# Patient Record
Sex: Male | Born: 1965 | Race: White | Hispanic: No | Marital: Married | Smoking: Current every day smoker
Health system: Southern US, Community
[De-identification: ages and names within clinical notes are randomized; demographics above are authoritative.]

## PROBLEM LIST (undated history)

## (undated) DIAGNOSIS — R519 Headache, unspecified: Secondary | ICD-10-CM

## (undated) DIAGNOSIS — R51 Headache: Secondary | ICD-10-CM

## (undated) HISTORY — PX: NO PAST SURGERIES: SHX2092

---

## 1998-06-06 ENCOUNTER — Emergency Department (HOSPITAL_COMMUNITY): Admission: EM | Admit: 1998-06-06 | Discharge: 1998-06-06 | Payer: Self-pay | Admitting: Emergency Medicine

## 1999-11-29 ENCOUNTER — Emergency Department (HOSPITAL_COMMUNITY): Admission: EM | Admit: 1999-11-29 | Discharge: 1999-11-29 | Payer: Self-pay | Admitting: Emergency Medicine

## 2015-02-16 ENCOUNTER — Emergency Department (HOSPITAL_COMMUNITY)
Admission: EM | Admit: 2015-02-16 | Discharge: 2015-02-16 | Disposition: A | Discharge status: Home / Self Care | Payer: BLUE CROSS/BLUE SHIELD

## 2015-03-27 ENCOUNTER — Other Ambulatory Visit: Payer: Self-pay | Admitting: Surgical

## 2015-04-02 NOTE — Patient Instructions (Signed)
Garrett Nolan  04/02/2015   Your procedure is scheduled on:     04/11/2015    Report to [facility] Main  Entrance and follow signs to               Short Stay Center at    1100 AM.  Call this number if you have problems the morning of surgery [phone]   Remember: ONLY 1 PERSON MAY GO WITH YOU TO SHORT STAY TO GET  READY MORNING OF YOUR SURGERY.  Do not eat food or drink liquids :After Midnight.     Take these medicines the morning of surgery with A SIP OF WATER:   Hydrocodone if needed                                You may not have any metal on your body including hair pins and              piercings  Do not wear jewelry, make-upshave  48 hours prior to surgery.              Men may shave face and neck.   Do not bring valuables to the hospital. [facility] IS NOT             RESPONSIBLE   FOR VALUABLES.  Contacts, dentures or bridgework may not be worn into surgery.  Leave suitcase in the car. After surgery it may be brought to your room.         Special Instructions: coughing and deep breathing exercises, leg exercises               Please read over the following fact sheets you were given: _____________________________________________________________________             [facility] - Preparing for Surgery Before surgery, you can play an important role.  Because skin is not sterile, your skin needs to be as free of germs as possible.  You can reduce the number of germs on your skin by washing with CHG (chlorahexidine gluconate) soap before surgery.  CHG is an antiseptic cleaner which kills germs and bonds with the skin to continue killing germs even after washing. Please DO NOT use if you have an allergy to CHG or antibacterial soaps.  If your skin becomes reddened/irritated stop using the CHG and inform your nurse when you arrive at Short Stay. Do not shave (including legs and underarms) for at least 48 hours prior to the first CHG shower.  You may  shave your face/neck. Please follow these instructions carefully:  1.  Shower with CHG Soap the night before surgery and the  morning of Surgery.  2.  If you choose to wash your hair, wash your hair first as usual with your  normal  shampoo.  3.  After you shampoo, rinse your hair and body thoroughly to remove the  shampoo.                           4.  Use CHG as you would any other liquid soap.  You can apply chg directly  to the skin and wash                       Gently with a scrungie or clean  washcloth.  5.  Apply the CHG Soap to your body ONLY FROM THE NECK DOWN.   Do not use on face/ open                           Wound or open sores. Avoid contact with eyes, ears mouth and genitals (private parts).                       Wash face,  Genitals (private parts) with your normal soap.             6.  Wash thoroughly, paying special attention to the area where your surgery  will be performed.  7.  Thoroughly rinse your body with warm water from the neck down.  8.  DO NOT shower/wash with your normal soap after using and rinsing off  the CHG Soap.                9.  Pat yourself dry with a clean towel.            10.  Wear clean pajamas.            11.  Place clean sheets on your bed the night of your first shower and do not  sleep with pets. Day of Surgery : Do not apply any lotions/deodorants the morning of surgery.  Please wear clean clothes to the hospital/surgery center.  FAILURE TO FOLLOW THESE INSTRUCTIONS MAY RESULT IN THE CANCELLATION OF YOUR SURGERY PATIENT SIGNATURE_________________________________  NURSE SIGNATURE__________________________________  ________________________________________________________________________  WHAT IS A BLOOD TRANSFUSION? Blood Transfusion Information  A transfusion is the replacement of blood or some of its parts. Blood is made up of multiple cells which provide different functions.  Red blood cells carry oxygen and are used for blood loss  replacement.  White blood cells fight against infection.  Platelets control bleeding.  Plasma helps clot blood.  Other blood products are available for specialized needs, such as hemophilia or other clotting disorders. BEFORE THE TRANSFUSION  Who gives blood for transfusions?   Healthy volunteers who are fully evaluated to make sure their blood is safe. This is blood bank blood. Transfusion therapy is the safest it has ever been in the practice of medicine. Before blood is taken from a donor, a complete history is taken to make sure that person has no history of diseases nor engages in risky social behavior (examples are intravenous drug use or sexual activity with multiple partners). The donor's travel history is screened to minimize risk of transmitting infections, such as malaria. The donated blood is tested for signs of infectious diseases, such as HIV and hepatitis. The blood is then tested to be sure it is compatible with you in order to minimize the chance of a transfusion reaction. If you or a relative donates blood, this is often done in anticipation of surgery and is not appropriate for emergency situations. It takes many days to process the donated blood. RISKS AND COMPLICATIONS Although transfusion therapy is very safe and saves many lives, the main dangers of transfusion include:  1. Getting an infectious disease. 2. Developing a transfusion reaction. This is an allergic reaction to something in the blood you were given. Every precaution is taken to prevent this. The decision to have a blood transfusion has been considered carefully by your caregiver before blood is given. Blood is not given unless the benefits outweigh the risks. AFTER THE TRANSFUSION  Right after receiving a blood transfusion, you will usually feel much better and more energetic. This is especially true if your red blood cells have gotten low (anemic). The transfusion raises the level of the red blood cells which  carry oxygen, and this usually causes an energy increase.  The nurse administering the transfusion will monitor you carefully for complications. HOME CARE INSTRUCTIONS  No special instructions are needed after a transfusion. You may find your energy is better. Speak with your caregiver about any limitations on activity for underlying diseases you may have. SEEK MEDICAL CARE IF:   Your condition is not improving after your transfusion.  You develop redness or irritation at the intravenous (IV) site. SEEK IMMEDIATE MEDICAL CARE IF:  Any of the following symptoms occur over the next 12 hours:  Shaking chills.  You have a temperature by mouth above 102 F (38.9 C), not controlled by medicine.  Chest, back, or muscle pain.  People around you feel you are not acting correctly or are confused.  Shortness of breath or difficulty breathing.  Dizziness and fainting.  You get a rash or develop hives.  You have a decrease in urine output.  Your urine turns a dark color or changes to pink, red, or brown. Any of the following symptoms occur over the next 10 days:  You have a temperature by mouth above 102 F (38.9 C), not controlled by medicine.  Shortness of breath.  Weakness after normal activity.  The white part of the eye turns yellow (jaundice).  You have a decrease in the amount of urine or are urinating less often.  Your urine turns a dark color or changes to pink, red, or brown. Document Released: 10/03/2000 Document Revised: 12/29/2011 Document Reviewed: 05/22/2008 Exit[employer] Patient Information 2014 [place].  _______________________________________________________________________  Incentive Spirometer  An incentive spirometer is a tool that can help keep your lungs clear and active. This tool measures how well you are filling your lungs with each breath. Taking long deep breaths may help reverse or decrease the chance of developing breathing (pulmonary) problems  (especially infection) following:  A long period of time when you are unable to move or be active. BEFORE THE PROCEDURE   If the spirometer includes an indicator to show your best effort, your nurse or respiratory therapist will set it to a desired goal.  If possible, sit up straight or lean slightly forward. Try not to slouch.  Hold the incentive spirometer in an upright position. INSTRUCTIONS FOR USE  3. Sit on the edge of your bed if possible, or sit up as far as you can in bed or on a chair. 4. Hold the incentive spirometer in an upright position. 5. Breathe out normally. 6. Place the mouthpiece in your mouth and seal your lips tightly around it. 7. Breathe in slowly and as deeply as possible, raising the piston or the ball toward the top of the column. 8. Hold your breath for 3-5 seconds or for as long as possible. Allow the piston or ball to fall to the bottom of the column. 9. Remove the mouthpiece from your mouth and breathe out normally. 10. Rest for a few seconds and repeat Steps 1 through 7 at least 10 times every 1-2 hours when you are awake. Take your time and take a few normal breaths between deep breaths. 11. The spirometer may include an indicator to show your best effort. Use the indicator as a goal to work toward during each repetition. 12. After  each set of 10 deep breaths, practice coughing to be sure your lungs are clear. If you have an incision (the cut made at the time of surgery), support your incision when coughing by placing a pillow or rolled up towels firmly against it. Once you are able to get out of bed, walk around indoors and cough well. You may stop using the incentive spirometer when instructed by your caregiver.  RISKS AND COMPLICATIONS  Take your time so you do not get dizzy or light-headed.  If you are in pain, you may need to take or ask for pain medication before doing incentive spirometry. It is harder to take a deep breath if you are having  pain. AFTER USE  Rest and breathe slowly and easily.  It can be helpful to keep track of a log of your progress. Your caregiver can provide you with a simple table to help with this. If you are using the spirometer at home, follow these instructions: [place] IF:   You are having difficultly using the spirometer.  You have trouble using the spirometer as often as instructed.  Your pain medication is not giving enough relief while using the spirometer.  You develop fever of 100.5 F (38.1 C) or higher. SEEK IMMEDIATE MEDICAL CARE IF:   You cough up bloody sputum that had not been present before.  You develop fever of 102 F (38.9 C) or greater.  You develop worsening pain at or near the incision site. MAKE SURE YOU:   Understand these instructions.  Will watch your condition.  Will get help right away if you are not doing well or get worse. Document Released: 02/16/2007 Document Revised: 12/29/2011 Document Reviewed: 04/19/2007 [facility] Patient Information 2014 [place], [place].   ________________________________________________________________________

## 2015-04-05 ENCOUNTER — Ambulatory Visit (HOSPITAL_COMMUNITY)
Admission: RE | Admit: 2015-04-05 | Discharge: 2015-04-05 | Disposition: A | Discharge status: Home / Self Care | Payer: BLUE CROSS/BLUE SHIELD | Source: Ambulatory Visit | Attending: Surgical | Admitting: Surgical

## 2015-04-05 ENCOUNTER — Encounter (HOSPITAL_COMMUNITY): Payer: Self-pay

## 2015-04-05 ENCOUNTER — Encounter (HOSPITAL_COMMUNITY)
Admission: RE | Admit: 2015-04-05 | Discharge: 2015-04-05 | Disposition: A | Discharge status: Home / Self Care | Payer: BLUE CROSS/BLUE SHIELD | Source: Ambulatory Visit | Attending: Orthopedic Surgery | Admitting: Orthopedic Surgery

## 2015-04-05 DIAGNOSIS — I4510 Unspecified right bundle-branch block: Secondary | ICD-10-CM | POA: Diagnosis not present

## 2015-04-05 DIAGNOSIS — Z01812 Encounter for preprocedural laboratory examination: Secondary | ICD-10-CM | POA: Insufficient documentation

## 2015-04-05 DIAGNOSIS — Z01818 Encounter for other preprocedural examination: Secondary | ICD-10-CM

## 2015-04-05 DIAGNOSIS — Z0183 Encounter for blood typing: Secondary | ICD-10-CM | POA: Insufficient documentation

## 2015-04-05 HISTORY — DX: Headache, unspecified: R51.9

## 2015-04-05 HISTORY — DX: Headache: R51

## 2015-04-05 LAB — URINALYSIS, ROUTINE W REFLEX MICROSCOPIC
Bilirubin Urine: NEGATIVE
Glucose, UA: NEGATIVE mg/dL
Hgb urine dipstick: NEGATIVE
Ketones, ur: NEGATIVE mg/dL
Leukocytes, UA: NEGATIVE
Nitrite: NEGATIVE
Protein, ur: NEGATIVE mg/dL
Specific Gravity, Urine: 1.019 (ref 1.005–1.030)
Urobilinogen, UA: 0.2 mg/dL (ref 0.0–1.0)
pH: 5.5 (ref 5.0–8.0)

## 2015-04-05 LAB — PROTIME-INR
INR: 0.93 (ref 0.00–1.49)
Prothrombin Time: 12.7 seconds (ref 11.6–15.2)

## 2015-04-05 LAB — CBC WITH DIFFERENTIAL/PLATELET
Basophils Absolute: 0.1 10*3/uL (ref 0.0–0.1)
Basophils Relative: 1 % (ref 0–1)
Eosinophils Absolute: 0.5 10*3/uL (ref 0.0–0.7)
Eosinophils Relative: 7 % — ABNORMAL HIGH (ref 0–5)
HCT: 47.5 % (ref 39.0–52.0)
Hemoglobin: 16.7 g/dL (ref 13.0–17.0)
Lymphocytes Relative: 27 % (ref 12–46)
Lymphs Abs: 2 10*3/uL (ref 0.7–4.0)
MCH: 30 pg (ref 26.0–34.0)
MCHC: 35.2 g/dL (ref 30.0–36.0)
MCV: 85.4 fL (ref 78.0–100.0)
Monocytes Absolute: 0.5 10*3/uL (ref 0.1–1.0)
Monocytes Relative: 7 % (ref 3–12)
Neutro Abs: 4.4 10*3/uL (ref 1.7–7.7)
Neutrophils Relative %: 58 % (ref 43–77)
Platelets: 276 10*3/uL (ref 150–400)
RBC: 5.56 MIL/uL (ref 4.22–5.81)
RDW: 13.5 % (ref 11.5–15.5)
WBC: 7.5 10*3/uL (ref 4.0–10.5)

## 2015-04-05 LAB — SURGICAL PCR SCREEN
MRSA, PCR: NEGATIVE
Staphylococcus aureus: POSITIVE — AB

## 2015-04-05 LAB — COMPREHENSIVE METABOLIC PANEL
ALT: 33 U/L (ref 17–63)
AST: 22 U/L (ref 15–41)
Albumin: 4.5 g/dL (ref 3.5–5.0)
Alkaline Phosphatase: 74 U/L (ref 38–126)
Anion gap: 7 (ref 5–15)
BUN: 10 mg/dL (ref 6–20)
CO2: 28 mmol/L (ref 22–32)
Calcium: 9.5 mg/dL (ref 8.9–10.3)
Chloride: 101 mmol/L (ref 101–111)
Creatinine, Ser: 0.93 mg/dL (ref 0.61–1.24)
GFR calc Af Amer: >60 mL/min (ref >60)
GFR calc non Af Amer: >60 mL/min (ref >60)
Glucose, Bld: 92 mg/dL (ref 65–99)
Potassium: 4.1 mmol/L (ref 3.5–5.1)
Sodium: 136 mmol/L (ref 135–145)
Total Bilirubin: 0.8 mg/dL (ref 0.3–1.2)
Total Protein: 7.6 g/dL (ref 6.5–8.1)

## 2015-04-05 LAB — ABO/RH: ABO/RH(D): A POS

## 2015-04-05 NOTE — Progress Notes (Signed)
Final EKG done 04/05/15 in [facility].   

## 2015-04-10 NOTE — H&P (Signed)
Garrett Nolan is an 49 y.o. male.   Chief Complaint: back pain HPI: Garrett Nolan presented with the chief complaint of low back pain. About two months ago, he was walking out in his yard. He has pavers where there is a separation and he tripped and he fell. The next day, he awakened, his right leg was numb. His chief complaint now is pain and numbness in the right lower extremity, especially his entire right foot is numb. He has had continued pain in the low back as well. MRI shows that he has a large herniated disc that migrates caudal-ward down over the body. It is at L4-L5 on the right and it goes down distally over the body L5.  Past Medical History  Diagnosis Date  . Headache     occasional headache     Past Surgical History  Procedure Laterality Date  . No past surgeries      No family history on file. Social History:  reports that he has been smoking Cigarettes.  He has a 34 pack-year smoking history. He has never used smokeless tobacco. He reports that he drinks alcohol. He reports that he does not use illicit drugs.  Allergies: No Known Allergies    Current outpatient prescriptions:  .  HYDROcodone-acetaminophen (NORCO/VICODIN) 5-325 MG per tablet, Take 1 tablet by mouth every 4 (four) hours as needed for moderate pain. , Disp: , Rfl: 0 .  ibuprofen (ADVIL) 200 MG tablet, Take 400 mg by mouth every 4 (four) hours as needed for headache or moderate pain., Disp: , Rfl:    Review of Systems  Constitutional: Negative.   HENT: Negative.   Eyes: Negative.   Respiratory: Negative.   Cardiovascular: Negative.   Genitourinary: Negative.   Musculoskeletal: Positive for back pain, joint pain and falls. Negative for myalgias and neck pain.  Skin: Negative.   Neurological: Positive for tingling and sensory change. Negative for dizziness, tremors, speech change, focal weakness, seizures and loss of consciousness.  Endo/Heme/Allergies: Negative.   Psychiatric/Behavioral: Negative.     Vitals  Weight: 245 lb Height: 73in Body Surface Area: 2.35 m Body Mass Index: 32.32 kg/m  BP: 127/80 (Sitting, Left Arm, Standard) HR: 80 bpm   Physical Exam  Constitutional: He is oriented to person, place, and time. He appears well-developed. No distress.  Overweight  HENT:  Head: Normocephalic and atraumatic.  Right Ear: External ear normal.  Left Ear: External ear normal.  Mouth/Throat: Oropharynx is clear and moist.  Eyes: Conjunctivae and EOM are normal.  Neck: Normal range of motion. Neck supple.  Cardiovascular: Normal rate, regular rhythm, normal heart sounds and intact distal pulses.   No murmur heard. Respiratory: Effort normal and breath sounds normal. No respiratory distress. He has no wheezes.  GI: Soft. Bowel sounds are normal. He exhibits no distension. There is no tenderness.  Musculoskeletal:       Right hip: Normal.       Left hip: Normal.       Right knee: Normal.       Left knee: Normal.       Lumbar back: He exhibits pain and spasm.  He has marked weakness of the dorsiflexors of his right foot. He has a positive straight leg raising on the right. His back motion is painful, especially if you tilt him to the right.  Neurological: He is alert and oriented to person, place, and time. He has normal reflexes. A sensory deficit is present.  Skin: No rash noted. He  is not diaphoretic. No erythema.  Psychiatric: He has a normal mood and affect. His behavior is normal.     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 We do use antibiotics prior to the surgery and during surgery and after surgery. Number two is always a slight degree of probability that you could develop a blood clot in your leg after any type of surgery and we try our best to prevent that with  aspirin post op when it is safe to begin. The third is a dural leak. That is the spinal fluid leak that could occur. At certain rare times the bone or the disc could literally stick to the dura which is the lining which contains the spinal fluid and we could develop a small tear in that lining which we then patch up. That is an extremely rare complication. The last and final complication is a recurrent disc rupture. That means that you could rupture another small piece of disc later on down the road and there is about a 2% chance of that.   H&P performed by Dr. Ranee Gosselin, MD  Garrett Nolan 04/10/2015, 9:05 AM

## 2015-04-11 ENCOUNTER — Ambulatory Visit (HOSPITAL_COMMUNITY): Payer: BLUE CROSS/BLUE SHIELD

## 2015-04-11 ENCOUNTER — Encounter (HOSPITAL_COMMUNITY): Payer: Self-pay | Admitting: *Deleted

## 2015-04-11 ENCOUNTER — Encounter (HOSPITAL_COMMUNITY)
Admission: RE | Disposition: A | Discharge status: Home / Self Care | Payer: Self-pay | Source: Ambulatory Visit | Attending: Orthopedic Surgery

## 2015-04-11 ENCOUNTER — Ambulatory Visit (HOSPITAL_COMMUNITY): Payer: BLUE CROSS/BLUE SHIELD | Admitting: Anesthesiology

## 2015-04-11 ENCOUNTER — Ambulatory Visit (HOSPITAL_COMMUNITY)
Admission: RE | Admit: 2015-04-11 | Discharge: 2015-04-12 | Disposition: A | Discharge status: Home / Self Care | Payer: BLUE CROSS/BLUE SHIELD | Source: Ambulatory Visit | Attending: Orthopedic Surgery | Admitting: Orthopedic Surgery

## 2015-04-11 DIAGNOSIS — Z419 Encounter for procedure for purposes other than remedying health state, unspecified: Secondary | ICD-10-CM

## 2015-04-11 DIAGNOSIS — Y998 Other external cause status: Secondary | ICD-10-CM | POA: Insufficient documentation

## 2015-04-11 DIAGNOSIS — F17210 Nicotine dependence, cigarettes, uncomplicated: Secondary | ICD-10-CM | POA: Insufficient documentation

## 2015-04-11 DIAGNOSIS — W010XXA Fall on same level from slipping, tripping and stumbling without subsequent striking against object, initial encounter: Secondary | ICD-10-CM | POA: Insufficient documentation

## 2015-04-11 DIAGNOSIS — Y92096 Garden or yard of other non-institutional residence as the place of occurrence of the external cause: Secondary | ICD-10-CM | POA: Diagnosis not present

## 2015-04-11 DIAGNOSIS — M21371 Foot drop, right foot: Secondary | ICD-10-CM | POA: Diagnosis not present

## 2015-04-11 DIAGNOSIS — M5126 Other intervertebral disc displacement, lumbar region: Secondary | ICD-10-CM | POA: Insufficient documentation

## 2015-04-11 DIAGNOSIS — Y9389 Activity, other specified: Secondary | ICD-10-CM | POA: Insufficient documentation

## 2015-04-11 DIAGNOSIS — M4806 Spinal stenosis, lumbar region: Secondary | ICD-10-CM | POA: Diagnosis not present

## 2015-04-11 DIAGNOSIS — M48062 Spinal stenosis, lumbar region with neurogenic claudication: Secondary | ICD-10-CM | POA: Diagnosis present

## 2015-04-11 HISTORY — PX: HEMI-MICRODISCECTOMY LUMBAR LAMINECTOMY LEVEL 1: SHX5846

## 2015-04-11 LAB — TYPE AND SCREEN
ABO/RH(D): A POS
Antibody Screen: NEGATIVE

## 2015-04-11 SURGERY — HEMI-MICRODISCECTOMY LUMBAR LAMINECTOMY LEVEL 1
Anesthesia: General | Site: Back | Laterality: Right

## 2015-04-11 MED ORDER — HYDROMORPHONE HCL 1 MG/ML IJ SOLN
INTRAMUSCULAR | Status: AC
  Filled 2015-04-11: qty 1

## 2015-04-11 MED ORDER — GLYCOPYRROLATE 0.2 MG/ML IJ SOLN
INTRAMUSCULAR | Status: DC | PRN
  Administered 2015-04-11: .8 mg via INTRAVENOUS

## 2015-04-11 MED ORDER — LIDOCAINE HCL (CARDIAC) 20 MG/ML IV SOLN
INTRAVENOUS | Status: DC | PRN
  Administered 2015-04-11: 100 mg via INTRAVENOUS

## 2015-04-11 MED ORDER — BISACODYL 5 MG PO TBEC: 5.0000 mg | DELAYED_RELEASE_TABLET | Freq: Every day | ORAL | Status: DC | PRN

## 2015-04-11 MED ORDER — PHENOL 1.4 % MT LIQD: 1.0000 | OROMUCOSAL | Status: DC | PRN

## 2015-04-11 MED ORDER — ACETAMINOPHEN 325 MG PO TABS: 650.0000 mg | ORAL_TABLET | ORAL | Status: DC | PRN

## 2015-04-11 MED ORDER — ACETAMINOPHEN 650 MG RE SUPP: 650.0000 mg | RECTAL | Status: DC | PRN

## 2015-04-11 MED ORDER — MIDAZOLAM HCL 2 MG/2ML IJ SOLN
INTRAMUSCULAR | Status: AC
  Filled 2015-04-11: qty 2

## 2015-04-11 MED ORDER — BACITRACIN-NEOMYCIN-POLYMYXIN 400-5-5000 EX OINT
TOPICAL_OINTMENT | CUTANEOUS | Status: DC | PRN
  Administered 2015-04-11: 1 via TOPICAL

## 2015-04-11 MED ORDER — CEFAZOLIN SODIUM-DEXTROSE 2-3 GM-% IV SOLR
2.0000 g | INTRAVENOUS | Status: AC
  Administered 2015-04-11: 2 g via INTRAVENOUS

## 2015-04-11 MED ORDER — SUFENTANIL CITRATE 50 MCG/ML IV SOLN
INTRAVENOUS | Status: AC
Start: 2015-04-11 — End: 2015-04-11
  Filled 2015-04-11: qty 1

## 2015-04-11 MED ORDER — SODIUM CHLORIDE 0.9 % IR SOLN
Status: DC | PRN
  Administered 2015-04-11: 500 mL

## 2015-04-11 MED ORDER — OXYCODONE-ACETAMINOPHEN 5-325 MG PO TABS
1.0000 | ORAL_TABLET | ORAL | Status: DC | PRN
  Administered 2015-04-11: 1 via ORAL
  Administered 2015-04-12 (×2): 2 via ORAL
  Filled 2015-04-11: qty 2
  Filled 2015-04-11: qty 1
  Filled 2015-04-11: qty 2

## 2015-04-11 MED ORDER — PROMETHAZINE HCL 25 MG/ML IJ SOLN: 6.2500 mg | INTRAMUSCULAR | Status: DC | PRN

## 2015-04-11 MED ORDER — THROMBIN 5000 UNITS EX SOLR
CUTANEOUS | Status: AC
  Filled 2015-04-11: qty 10000

## 2015-04-11 MED ORDER — SUFENTANIL CITRATE 50 MCG/ML IV SOLN
INTRAVENOUS | Status: DC | PRN
  Administered 2015-04-11: 10 ug via INTRAVENOUS
  Administered 2015-04-11: 20 ug via INTRAVENOUS
  Administered 2015-04-11: 10 ug via INTRAVENOUS
  Administered 2015-04-11: 5 ug via INTRAVENOUS

## 2015-04-11 MED ORDER — METHOCARBAMOL 500 MG PO TABS
500.0000 mg | ORAL_TABLET | Freq: Four times a day (QID) | ORAL | Status: AC | PRN
Start: 2015-04-11 — End: ?

## 2015-04-11 MED ORDER — CISATRACURIUM BESYLATE 20 MG/10ML IV SOLN
INTRAVENOUS | Status: AC
  Filled 2015-04-11: qty 10

## 2015-04-11 MED ORDER — LACTATED RINGERS IV SOLN
INTRAVENOUS | Status: DC
Start: 2015-04-11 — End: 2015-04-11
  Administered 2015-04-11: 14:00:00 via INTRAVENOUS
  Administered 2015-04-11: 1000 mL via INTRAVENOUS

## 2015-04-11 MED ORDER — HYDROMORPHONE HCL 1 MG/ML IJ SOLN
0.2500 mg | INTRAMUSCULAR | Status: DC | PRN
  Administered 2015-04-11: 0.5 mg via INTRAVENOUS
  Administered 2015-04-11 (×3): 0.25 mg via INTRAVENOUS
  Administered 2015-04-11: 0.5 mg via INTRAVENOUS

## 2015-04-11 MED ORDER — ONDANSETRON HCL 4 MG/2ML IJ SOLN
INTRAMUSCULAR | Status: AC
  Filled 2015-04-11: qty 2

## 2015-04-11 MED ORDER — METHOCARBAMOL 500 MG PO TABS: 500.0000 mg | ORAL_TABLET | Freq: Four times a day (QID) | ORAL | Status: DC | PRN

## 2015-04-11 MED ORDER — LIDOCAINE HCL (CARDIAC) 20 MG/ML IV SOLN
INTRAVENOUS | Status: AC
  Filled 2015-04-11: qty 5

## 2015-04-11 MED ORDER — SODIUM CHLORIDE 0.9 % IR SOLN
Status: AC
  Filled 2015-04-11: qty 1

## 2015-04-11 MED ORDER — BUPIVACAINE-EPINEPHRINE (PF) 0.5% -1:200000 IJ SOLN
INTRAMUSCULAR | Status: DC | PRN
  Administered 2015-04-11: 20 mL

## 2015-04-11 MED ORDER — CEFAZOLIN SODIUM 1-5 GM-% IV SOLN
1.0000 g | Freq: Three times a day (TID) | INTRAVENOUS | Status: DC
  Administered 2015-04-11 – 2015-04-12 (×2): 1 g via INTRAVENOUS
  Filled 2015-04-11 (×2): qty 50

## 2015-04-11 MED ORDER — HYDROMORPHONE HCL 1 MG/ML IJ SOLN: 0.5000 mg | INTRAMUSCULAR | Status: DC | PRN

## 2015-04-11 MED ORDER — LACTATED RINGERS IV SOLN: INTRAVENOUS | Status: DC

## 2015-04-11 MED ORDER — PROPOFOL 10 MG/ML IV BOLUS
INTRAVENOUS | Status: DC | PRN
  Administered 2015-04-11: 200 mg via INTRAVENOUS

## 2015-04-11 MED ORDER — SUCCINYLCHOLINE CHLORIDE 20 MG/ML IJ SOLN
INTRAMUSCULAR | Status: DC | PRN
  Administered 2015-04-11: 100 mg via INTRAVENOUS

## 2015-04-11 MED ORDER — DEXTROSE 5 % IV SOLN
500.0000 mg | Freq: Four times a day (QID) | INTRAVENOUS | Status: DC | PRN
  Filled 2015-04-11: qty 5

## 2015-04-11 MED ORDER — CEFAZOLIN SODIUM-DEXTROSE 2-3 GM-% IV SOLR
INTRAVENOUS | Status: AC
  Filled 2015-04-11: qty 50

## 2015-04-11 MED ORDER — POLYETHYLENE GLYCOL 3350 17 G PO PACK: 17.0000 g | PACK | Freq: Every day | ORAL | Status: DC | PRN

## 2015-04-11 MED ORDER — CISATRACURIUM BESYLATE (PF) 10 MG/5ML IV SOLN
INTRAVENOUS | Status: DC | PRN
  Administered 2015-04-11: 4 mg via INTRAVENOUS
  Administered 2015-04-11 (×2): 2 mg via INTRAVENOUS
  Administered 2015-04-11: 8 mg via INTRAVENOUS

## 2015-04-11 MED ORDER — MIDAZOLAM HCL 5 MG/5ML IJ SOLN
INTRAMUSCULAR | Status: DC | PRN
  Administered 2015-04-11: 2 mg via INTRAVENOUS

## 2015-04-11 MED ORDER — MENTHOL 3 MG MT LOZG: 1.0000 | LOZENGE | OROMUCOSAL | Status: DC | PRN

## 2015-04-11 MED ORDER — BUPIVACAINE-EPINEPHRINE (PF) 0.5% -1:200000 IJ SOLN
INTRAMUSCULAR | Status: AC
  Filled 2015-04-11: qty 30

## 2015-04-11 MED ORDER — PROPOFOL 10 MG/ML IV BOLUS
INTRAVENOUS | Status: AC
  Filled 2015-04-11: qty 20

## 2015-04-11 MED ORDER — HYDROCODONE-ACETAMINOPHEN 5-325 MG PO TABS
1.0000 | ORAL_TABLET | ORAL | Status: DC | PRN
Start: 2015-04-11 — End: 2015-04-12

## 2015-04-11 MED ORDER — ONDANSETRON HCL 4 MG/2ML IJ SOLN
INTRAMUSCULAR | Status: DC | PRN
  Administered 2015-04-11: 4 mg via INTRAVENOUS

## 2015-04-11 MED ORDER — NEOSTIGMINE METHYLSULFATE 10 MG/10ML IV SOLN
INTRAVENOUS | Status: DC | PRN
  Administered 2015-04-11: 5 mg via INTRAVENOUS

## 2015-04-11 MED ORDER — SODIUM CHLORIDE 0.9 % IJ SOLN
INTRAMUSCULAR | Status: AC
  Filled 2015-04-11: qty 10

## 2015-04-11 MED ORDER — DEXAMETHASONE SODIUM PHOSPHATE 10 MG/ML IJ SOLN
INTRAMUSCULAR | Status: DC | PRN
  Administered 2015-04-11: 10 mg via INTRAVENOUS

## 2015-04-11 MED ORDER — EPHEDRINE SULFATE 50 MG/ML IJ SOLN
INTRAMUSCULAR | Status: DC | PRN
  Administered 2015-04-11 (×3): 10 mg via INTRAVENOUS

## 2015-04-11 MED ORDER — FLEET ENEMA 7-19 GM/118ML RE ENEM: 1.0000 | ENEMA | Freq: Once | RECTAL | Status: AC | PRN

## 2015-04-11 MED ORDER — GLYCOPYRROLATE 0.2 MG/ML IJ SOLN
INTRAMUSCULAR | Status: AC
  Filled 2015-04-11: qty 4

## 2015-04-11 MED ORDER — OXYCODONE-ACETAMINOPHEN 5-325 MG PO TABS: 1.0000 | ORAL_TABLET | ORAL | Status: AC | PRN

## 2015-04-11 MED ORDER — DEXAMETHASONE SODIUM PHOSPHATE 10 MG/ML IJ SOLN
INTRAMUSCULAR | Status: AC
  Filled 2015-04-11: qty 1

## 2015-04-11 MED ORDER — EPHEDRINE SULFATE 50 MG/ML IJ SOLN
INTRAMUSCULAR | Status: AC
  Filled 2015-04-11: qty 1

## 2015-04-11 MED ORDER — BUPIVACAINE LIPOSOME 1.3 % IJ SUSP
20.0000 mL | Freq: Once | INTRAMUSCULAR | Status: AC
  Administered 2015-04-11: 20 mL
  Filled 2015-04-11: qty 20

## 2015-04-11 MED ORDER — THROMBIN 5000 UNITS EX SOLR
OROMUCOSAL | Status: DC | PRN
  Administered 2015-04-11: 10 mL via TOPICAL

## 2015-04-11 MED ORDER — ONDANSETRON HCL 4 MG/2ML IJ SOLN: 4.0000 mg | INTRAMUSCULAR | Status: DC | PRN

## 2015-04-11 MED ORDER — BACITRACIN-NEOMYCIN-POLYMYXIN 400-5-5000 EX OINT
TOPICAL_OINTMENT | CUTANEOUS | Status: AC
  Filled 2015-04-11: qty 1

## 2015-04-11 SURGICAL SUPPLY — 36 items
BAG ZIPLOCK 12X15 (MISCELLANEOUS) ×2 IMPLANT
CLEANER TIP ELECTROSURG 2X2 (MISCELLANEOUS) ×2 IMPLANT
DRAPE MICROSCOPE LEICA (MISCELLANEOUS) ×2 IMPLANT
DRAPE SHEET LG 3/4 BI-LAMINATE (DRAPES) ×2 IMPLANT
DRAPE SURG 17X11 SM STRL (DRAPES) ×2 IMPLANT
DRSG ADAPTIC 3X8 NADH LF (GAUZE/BANDAGES/DRESSINGS) ×2 IMPLANT
DRSG PAD ABDOMINAL 8X10 ST (GAUZE/BANDAGES/DRESSINGS) ×4 IMPLANT
DURAPREP 26ML APPLICATOR (WOUND CARE) ×2 IMPLANT
ELECT BLADE TIP CTD 4 INCH (ELECTRODE) ×2 IMPLANT
ELECT REM PT RETURN 9FT ADLT (ELECTROSURGICAL) ×2
ELECTRODE REM PT RTRN 9FT ADLT (ELECTROSURGICAL) ×1 IMPLANT
GAUZE SPONGE 4X4 12PLY STRL (GAUZE/BANDAGES/DRESSINGS) ×2 IMPLANT
GLOVE BIO SURGEON STRL SZ7.5 (GLOVE) ×2 IMPLANT
GLOVE BIOGEL PI IND STRL 7.5 (GLOVE) ×1 IMPLANT
GLOVE BIOGEL PI IND STRL 8 (GLOVE) ×2 IMPLANT
GLOVE BIOGEL PI INDICATOR 7.5 (GLOVE) ×1
GLOVE BIOGEL PI INDICATOR 8 (GLOVE) ×2
GLOVE ECLIPSE 8.0 STRL XLNG CF (GLOVE) ×4 IMPLANT
GOWN STRL REUS W/TWL XL LVL3 (GOWN DISPOSABLE) ×6 IMPLANT
KIT BASIN OR (CUSTOM PROCEDURE TRAY) ×2 IMPLANT
KIT POSITIONING SURG ANDREWS (MISCELLANEOUS) ×2 IMPLANT
MANIFOLD NEPTUNE II (INSTRUMENTS) ×2 IMPLANT
NEEDLE SPNL 18GX3.5 QUINCKE PK (NEEDLE) ×4 IMPLANT
PACK LAMINECTOMY ORTHO (CUSTOM PROCEDURE TRAY) ×2 IMPLANT
PATTIES SURGICAL .5 X.5 (GAUZE/BANDAGES/DRESSINGS) ×2 IMPLANT
PATTIES SURGICAL .75X.75 (GAUZE/BANDAGES/DRESSINGS) ×2 IMPLANT
PATTIES SURGICAL 1X1 (DISPOSABLE) ×2 IMPLANT
SPONGE LAP 4X18 X RAY DECT (DISPOSABLE) ×4 IMPLANT
SPONGE SURGIFOAM ABS GEL 100 (HEMOSTASIS) ×2 IMPLANT
STAPLER VISISTAT 35W (STAPLE) ×2 IMPLANT
SUT VIC AB 0 CT1 27 (SUTURE)
SUT VIC AB 0 CT1 27XBRD ANTBC (SUTURE) IMPLANT
SUT VIC AB 1 CT1 27 (SUTURE) ×3
SUT VIC AB 1 CT1 27XBRD ANTBC (SUTURE) ×3 IMPLANT
TOWEL OR 17X26 10 PK STRL BLUE (TOWEL DISPOSABLE) ×2 IMPLANT
TOWEL OR NON WOVEN STRL DISP B (DISPOSABLE) ×2 IMPLANT

## 2015-04-11 NOTE — Anesthesia Procedure Notes (Signed)
Procedure Name: Intubation Date/Time: 04/11/2015 1:17 PM Performed by: Florene Route Pre-anesthesia Checklist: Patient identified, Emergency Drugs available, Suction available and Patient being monitored Patient Re-evaluated:Patient Re-evaluated prior to inductionOxygen Delivery Method: Circle System Utilized Preoxygenation: Pre-oxygenation with 100% oxygen Intubation Type: IV induction Ventilation: Mask ventilation without difficulty Laryngoscope Size: Miller and 3 Grade View: Grade I Tube type: Oral Tube size: 8.0 mm Number of attempts: 1 Airway Equipment and Method: Stylet Placement Confirmation: ETT inserted through vocal cords under direct vision,  positive ETCO2 and breath sounds checked- equal and bilateral Secured at: 22 cm Tube secured with: Tape Dental Injury: Teeth and Oropharynx as per pre-operative assessment

## 2015-04-11 NOTE — Interval H&P Note (Signed)
History and Physical Interval Note:  04/11/2015 12:30 PM  Garrett Nolan  has presented today for surgery, with the diagnosis of herniated disc  The various methods of treatment have been discussed with the patient and family. After consideration of risks, benefits and other options for treatment, the patient has consented to  Procedure(s): LUMBAR HEMI-LAMINECTOMY MICRODISCECTOMY  L4 - L5 ON THE RIGHT LEVEL 1 (Right) as a surgical intervention .  The patient's history has been reviewed, patient examined, no change in status, stable for surgery.  I have reviewed the patient's chart and labs.  Questions were answered to the patient's satisfaction.     [employer] A

## 2015-04-11 NOTE — Brief Op Note (Signed)
04/11/2015  3:27 PM  PATIENT:  Garrett Nolan  49 y.o. 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  POST-OPERATIVE DIAGNOSIS:  herniated disc L4-L5 right  PROCEDURE:  Procedure(s): CENTRAL DECOMPRESSION,LUMBAR HEMI-LAMINECTOMY MICRODISCECTOMY  L4 - L5 ON THE RIGHT, FORAMINOTOMY L4 AND L5 ROOT ON THE RIGHT. (Right) Central .Decompressive Lumbar laminectomy for Spinal Stenosis at L-4-L-5  SURGEON:  Surgeon(s) and Role:    * Ranee Gosselin, MD - Primary    * Drucilla Schmidt, MD - Assisting     ASSISTANTS: Marlowe Kays MD  ANESTHESIA:   general  EBL:  Total I/O In: 1400 [I.V.:1400] Out: 75 [Blood:75]  BLOOD ADMINISTERED:none  DRAINS: none   LOCAL MEDICATIONS USED:  MARCAINE 20cc of 0.5% with Epinephrine    SPECIMEN:  No Specimen  DISPOSITION OF SPECIMEN:  N/A  COUNTS:  YES  TOURNIQUET:  * No tourniquets in log *  DICTATION: .Other Dictation: Dictation Number [id number]  PLAN OF CARE: Admit for overnight observation  PATIENT DISPOSITION:  PACU - hemodynamically stable.   Delay start of Pharmacological VTE agent (>24hrs) due to surgical blood loss or risk of bleeding: yes

## 2015-04-11 NOTE — Plan of Care (Signed)
Problem: Consults Goal: Diagnosis - Spinal Surgery Lumbar Laminectomy (Complex)     

## 2015-04-11 NOTE — Anesthesia Postprocedure Evaluation (Signed)
  Anesthesia Post-op Note  Patient: Garrett Nolan  Procedure(s) Performed: Procedure(s): CENTRAL DECOMPRESSION,LUMBAR HEMI-LAMINECTOMY MICRODISCECTOMY  L4 - L5 ON THE RIGHT, FORAMINOTOMY L4 AND L5 ROOT ON THE RIGHT. (Right)  Patient Location: PACU  Anesthesia Type:General  Level of Consciousness: awake  Airway and Oxygen Therapy: Patient Spontanous Breathing  Post-op Pain: mild  Post-op Assessment: Post-op Vital signs reviewed              Post-op Vital Signs: Reviewed  Last Vitals:  Filed Vitals:   04/11/15 1831  BP: 133/76  Pulse: 83  Temp: 36.6 C  Resp: 19    Complications: No apparent anesthesia complications

## 2015-04-11 NOTE — Discharge Instructions (Signed)
For the first few days, remove your dressing, tape a piece of saran wrap over your incision.  °Take your shower, then remove the saran wrap and put a clean dressing on. °After three days you can shower without the saran wrap.  °Call Dr. Chelcey Caputo if any wound complications or temperature of 101 degrees F or over.  °Call the office for an appointment to see Dr. Varnika Butz in two weeks: [phone] and ask for Dr. Chinita Schimpf's nurse, Tammy Johnson.  °

## 2015-04-11 NOTE — Anesthesia Preprocedure Evaluation (Signed)
Anesthesia Evaluation  Patient identified by MRN, date of birth, ID band Patient awake    Reviewed: Allergy & Precautions, NPO status , Patient's Chart, lab work & pertinent test results  Airway Mallampati: II  TM Distance: >3 FB Neck ROM: Full    Dental   Pulmonary Current Smoker,  breath sounds clear to auscultation        Cardiovascular negative cardio ROS  Rhythm:Regular Rate:Normal     Neuro/Psych    GI/Hepatic negative GI ROS, Neg liver ROS,   Endo/Other  negative endocrine ROS  Renal/GU negative Renal ROS     Musculoskeletal   Abdominal   Peds  Hematology   Anesthesia Other Findings   Reproductive/Obstetrics                             Anesthesia Physical Anesthesia Plan  ASA: II  Anesthesia Plan: General   Post-op Pain Management:    Induction: Intravenous  Airway Management Planned: Oral ETT  Additional Equipment:   Intra-op Plan:   Post-operative Plan: Extubation in OR  Informed Consent: I have reviewed the patients History and Physical, chart, labs and discussed the procedure including the risks, benefits and alternatives for the proposed anesthesia with the patient or authorized representative who has indicated his/her understanding and acceptance.   Dental advisory given  Plan Discussed with: CRNA and Anesthesiologist  Anesthesia Plan Comments:         Anesthesia Quick Evaluation

## 2015-04-11 NOTE — Transfer of Care (Signed)
Immediate Anesthesia Transfer of Care Note  Patient: Garrett Nolan  Procedure(s) Performed: Procedure(s): CENTRAL DECOMPRESSION,LUMBAR HEMI-LAMINECTOMY MICRODISCECTOMY  L4 - L5 ON THE RIGHT, FORAMINOTOMY L4 AND L5 ROOT ON THE RIGHT. (Right)  Patient Location: PACU  Anesthesia Type:General  Level of Consciousness: awake, alert  and oriented  Airway & Oxygen Therapy: Patient Spontanous Breathing and Patient connected to face mask oxygen  Post-op Assessment: Report given to RN and Post -op Vital signs reviewed and stable  Post vital signs: Reviewed and stable  Last Vitals:  Filed Vitals:   04/11/15 1047  BP: 141/84  Pulse: 72  Temp: 36.5 C  Resp: 18    Complications: No apparent anesthesia complications

## 2015-04-12 ENCOUNTER — Encounter (HOSPITAL_COMMUNITY): Payer: Self-pay | Admitting: Orthopedic Surgery

## 2015-04-12 DIAGNOSIS — M4806 Spinal stenosis, lumbar region: Secondary | ICD-10-CM | POA: Diagnosis not present

## 2015-04-12 NOTE — Op Note (Signed)
NAMEWAVELY, DEICHERT                ACCOUNT NO.:  [id number]  MEDICAL RECORD NO.:  [id number]  LOCATION:  1609                         FACILITY:  [facility]  PHYSICIAN:  Georges Lynch. Arcadio Cope, M.D.DATE OF BIRTH:  08/15/1966  DATE OF PROCEDURE:  04/11/2015 DATE OF DISCHARGE:                              OPERATIVE REPORT   SURGEON:  Georges Lynch. Darrelyn Hillock, M.D.  ASSISTANT:  Marlowe Kays, M.D.  PREOPERATIVE DIAGNOSES: 1. Spinal stenosis at L4-5. 2. Large herniated disk with caudal migration distally at L4-5. 3. Foraminal stenosis of the L4 and L5 roots on the right. 4. Partial footdrop on the right, all his pain was on the right leg.  POSTOPERATIVE DIAGNOSES: 1. Spinal stenosis at L4-5. 2. Large herniated disk with caudal migration distally at L4-5. 3. Foraminal stenosis of the L4 and L5 roots on the right. 4. Partial footdrop on the right, all his pain was on the right leg.  OPERATION: 1. Complete central decompressive lumbar laminectomy for spinal     stenosis. 2. Microdiskectomy at L4-5 and the decompression was at L4-5. 3. Foraminotomies for the L4 and L5 roots on the right side only.  DESCRIPTION OF PROCEDURE:  Under general anesthesia, routine orthopedic prepping and draping was carried out with the patient on a spinal frame. The appropriate time-out was first carried out.  I also marked the appropriate right side of his back in the holding area.  At this time after the time-out, two needles were placed in the back after the prep, x-ray was taken.  Following that, an incision was made over the L4-5 interspace.  The incision was extended distally and proximally.  Self- retaining retractors were inserted.  I then went down and identified the spinous process of L4.  We then stripped the muscle from the lamina and spinous process bilaterally.  I did insert the McCullough retractors after an x-ray was taken with Kocher clamp and a Penfield 4 in place. At that time, I then inserted the  [facility] retractors.  We thoroughly irrigated out the wound during the procedure.  Prior to surgery, he had 2 g of IV Ancef.  Also prior to the surgery, I injected about 20 mL of 0.5% Marcaine with epinephrine.  At the end of the procedure, I injected him with 20 mL of Exparel.  Once we established the interspace, I then removed the spinous process of L4 and then went down and did a central decompression because of extreme tightness of the canal and because of the fact that he had a large herniated disk.  Once we went down to the ligamentum flavum, the microscope was brought in.  We then gently removed the ligamentum flavum.  Great care was taken to protect the underlying dura.  Cottonoids were inserted to protect the dura and I then carried out my hemilaminectomy distally of L5.  I did remove centrally the L4 spinous process and lamina.  So basically, we went proximally and distally along that right gutter region, but the disk was large and migrated distally.  We did insert instruments and took another x-ray to identify the space.  I then cauterized the lateral recess veins after I gently retracted the nerve  root and the dura.  Cruciate incision was made in the posterior longitudinal ligament and large disk fragments were removed.  I utilized the nerve hook and went distally up under the posterior longitudinal ligament and removed the fragment that migrated distally as well.  We thoroughly irrigated out the area and explored proximally and distally and laterally and medially, and make sure there were no other loose pieces of disk and there were not.  I thoroughly irrigated out the area.  I removed the fluid and then loosely applied some thrombin-soaked Gelfoam.  Following that, I closed the wound in layers in usual fashion except I left a small distal and proximal deep part of the wound open for drainage purposes.  I injected 20 mL of Exparel before closing the subcu and I closed the  subcu with #1 Vicryl and skin with metal staples.  Sterile dressings were applied.  The patient left the operating room in satisfactory condition and he was stable.          ______________________________ Georges Lynch. Darrelyn Hillock, M.D.     RAG/MEDQ  D:  04/11/2015  T:  04/12/2015  Job:  [id number]

## 2015-04-16 NOTE — Discharge Summary (Signed)
Physician Discharge Summary   Patient ID: Garrett Nolan MRN: [id number] DOB/AGE: 03/25/1966 49 y.o.  Admit date: 04/11/2015 Discharge date: 04/12/2015  Primary Diagnosis: Lumbar spinal stenosis  Admission Diagnoses:  Past Medical History  Diagnosis Date  . Headache     occasional headache    Discharge Diagnoses:   Active Problems:   Spinal stenosis, lumbar region, with neurogenic claudication  Estimated body mass index is 34.88 kg/(m^2) as calculated from the following:   Height as of this encounter: 5' 11"  (1.803 m).   Weight as of this encounter: 113.399 kg (250 lb).  Procedure:  Procedure(s) (LRB): CENTRAL DECOMPRESSION,LUMBAR HEMI-LAMINECTOMY MICRODISCECTOMY  L4 - L5 ON THE RIGHT, FORAMINOTOMY L4 AND L5 ROOT ON THE RIGHT. (Right)   Consults: None  HPI: Garrett Nolan presented with the chief complaint of low back pain. About two months ago, he was walking out in his yard. He has pavers where there is a separation and he tripped and he fell. The next day, he awakened, his right leg was numb. His chief complaint now is pain and numbness in the right lower extremity, especially his entire right foot is numb. He has had continued pain in the low back as well. MRI shows that he has a large herniated disc that migrates caudal-ward down over the body. It is at L4-L5 on the right and it goes down distally over the body L5.  Laboratory Data: Hospital Outpatient Visit on 04/05/2015  Component Date Value Ref Range Status  . ABO/RH(D) 04/05/2015 A POS   Final  Hospital Outpatient Visit on 04/05/2015  Component Date Value Ref Range Status  . WBC 04/05/2015 7.5  4.0 - 10.5 K/uL Final  . RBC 04/05/2015 5.56  4.22 - 5.81 MIL/uL Final  . Hemoglobin 04/05/2015 16.7  13.0 - 17.0 g/dL Final  . HCT 04/05/2015 47.5  39.0 - 52.0 % Final  . MCV 04/05/2015 85.4  78.0 - 100.0 fL Final  . MCH 04/05/2015 30.0  26.0 - 34.0 pg Final  . MCHC 04/05/2015 35.2  30.0 - 36.0 g/dL Final  . RDW 04/05/2015 13.5   11.5 - 15.5 % Final  . Platelets 04/05/2015 276  150 - 400 K/uL Final  . Neutrophils Relative % 04/05/2015 58  43 - 77 % Final  . Neutro Abs 04/05/2015 4.4  1.7 - 7.7 K/uL Final  . Lymphocytes Relative 04/05/2015 27  12 - 46 % Final  . Lymphs Abs 04/05/2015 2.0  0.7 - 4.0 K/uL Final  . Monocytes Relative 04/05/2015 7  3 - 12 % Final  . Monocytes Absolute 04/05/2015 0.5  0.1 - 1.0 K/uL Final  . Eosinophils Relative 04/05/2015 7* 0 - 5 % Final  . Eosinophils Absolute 04/05/2015 0.5  0.0 - 0.7 K/uL Final  . Basophils Relative 04/05/2015 1  0 - 1 % Final  . Basophils Absolute 04/05/2015 0.1  0.0 - 0.1 K/uL Final  . Sodium 04/05/2015 136  135 - 145 mmol/L Final  . Potassium 04/05/2015 4.1  3.5 - 5.1 mmol/L Final  . Chloride 04/05/2015 101  101 - 111 mmol/L Final  . CO2 04/05/2015 28  22 - 32 mmol/L Final  . Glucose, Bld 04/05/2015 92  65 - 99 mg/dL Final  . BUN 04/05/2015 10  6 - 20 mg/dL Final  . Creatinine, Ser 04/05/2015 0.93  0.61 - 1.24 mg/dL Final  . Calcium 04/05/2015 9.5  8.9 - 10.3 mg/dL Final  . Total Protein 04/05/2015 7.6  6.5 - 8.1 g/dL Final  .  Albumin 04/05/2015 4.5  3.5 - 5.0 g/dL Final  . AST 04/05/2015 22  15 - 41 U/L Final  . ALT 04/05/2015 33  17 - 63 U/L Final  . Alkaline Phosphatase 04/05/2015 74  38 - 126 U/L Final  . Total Bilirubin 04/05/2015 0.8  0.3 - 1.2 mg/dL Final  . GFR calc non Af Amer 04/05/2015 >60  >60 mL/min Final  . GFR calc Af Amer 04/05/2015 >60  >60 mL/min Final   Comment: (NOTE) The eGFR has been calculated using the CKD EPI equation. This calculation has not been validated in all clinical situations. eGFR's persistently <60 mL/min signify possible Chronic Kidney Disease.   . Anion gap 04/05/2015 7  5 - 15 Final  . ABO/RH(D) 04/05/2015 A POS   Final  . Antibody Screen 04/05/2015 NEG   Final  . Sample Expiration 04/05/2015 04/14/2015   Final  . Color, Urine 04/05/2015 YELLOW  YELLOW Final  . APPearance 04/05/2015 CLEAR  CLEAR Final  .  Specific Gravity, Urine 04/05/2015 1.019  1.005 - 1.030 Final  . pH 04/05/2015 5.5  5.0 - 8.0 Final  . Glucose, UA 04/05/2015 NEGATIVE  NEGATIVE mg/dL Final  . Hgb urine dipstick 04/05/2015 NEGATIVE  NEGATIVE Final  . Bilirubin Urine 04/05/2015 NEGATIVE  NEGATIVE Final  . Ketones, ur 04/05/2015 NEGATIVE  NEGATIVE mg/dL Final  . Protein, ur 04/05/2015 NEGATIVE  NEGATIVE mg/dL Final  . Urobilinogen, UA 04/05/2015 0.2  0.0 - 1.0 mg/dL Final  . Nitrite 04/05/2015 NEGATIVE  NEGATIVE Final  . Leukocytes, UA 04/05/2015 NEGATIVE  NEGATIVE Final   MICROSCOPIC NOT DONE ON URINES WITH NEGATIVE PROTEIN, BLOOD, LEUKOCYTES, NITRITE, OR GLUCOSE <1000 mg/dL.  Marland Kitchen MRSA, PCR 04/05/2015 NEGATIVE  NEGATIVE Final  . Staphylococcus aureus 04/05/2015 POSITIVE* NEGATIVE Final   Comment:        The Xpert SA Assay (FDA approved for NASAL specimens in patients over 25 years of age), is one component of a comprehensive surveillance program.  Test performance has been validated by [facility] for patients greater than or equal to 51 year old. It is not intended to diagnose infection nor to guide or monitor treatment.   . Prothrombin Time 04/05/2015 12.7  11.6 - 15.2 seconds Final  . INR 04/05/2015 0.93  0.00 - 1.49 Final     X-Rays:Dg Chest 2 View  04/05/2015   CLINICAL DATA:  Preoperative clearance.  Preop back surgery  EXAM: CHEST  2 VIEW  COMPARISON:  None.  FINDINGS: The heart size and mediastinal contours are within normal limits. Both lungs are clear. The visualized skeletal structures are unremarkable.  IMPRESSION: No active cardiopulmonary disease.   Electronically Signed   By: Franchot Gallo M.D.   On: 04/05/2015 14:07   Dg Lumbar Spine 2-3 Views  04/05/2015   CLINICAL DATA:  Preop lumbar surgery.  EXAM: LUMBAR SPINE - 2-3 VIEW  COMPARISON:  None.  FINDINGS: There is no evidence of lumbar spine fracture. Alignment is normal. Intervertebral disc spaces are maintained. Endplate osteophyte at A2-5 and at  L4-5.  IMPRESSION: No acute osseous injury of the lumbar spine.   Electronically Signed   By: Kathreen Devoid   On: 04/05/2015 14:07   Dg Spine Portable 1 View  04/11/2015   CLINICAL DATA:  Lumbar laminectomy L4-5  EXAM: PORTABLE SPINE - 1 VIEW  COMPARISON:  None.  FINDINGS: Surgical instrument directed at the L4-5 disc space. Instrument overlies the spinal canal.  There is a second instrument directed at the L5-S1  disc space overlying the spinal canal.  IMPRESSION: Surgical instrument localizes the L4-5 disc space. Second instrument directed at the L5-S1 disc space.   Electronically Signed   By: Franchot Gallo M.D.   On: 04/11/2015 14:46   Dg Spine Portable 1 View  04/11/2015   CLINICAL DATA:  Lumbar laminectomy at L4-5  EXAM: PORTABLE SPINE - 1 VIEW  COMPARISON:  Lateral lumbar spine from earlier today  FINDINGS: Cross-table lateral lumbar spine view labeled 2 for localization shows a clamp on the spinous process of L4 and an instrument posteriorly directed toward the L4-5 interspace for localization.  IMPRESSION: Localization of L4-5.   Electronically Signed   By: Ivar Drape M.D.   On: 04/11/2015 14:03   Dg Spine Portable 1 View  04/11/2015   CLINICAL DATA:  L4-5 hemilaminectomy  EXAM: PORTABLE SPINE - 1 VIEW  COMPARISON:  None.  FINDINGS: Cross-table lateral lumbar image obtained. The more superiorly place metallic probe is posterior to the L4-5 interspace level. The more inferiorly placed probe is posterior to the mid L5 vertebral body level. No fracture or spondylolisthesis. Disc spaces appear intact.  IMPRESSION: Metallic probe tips are posterior to the L4-5 and mid L5 levels. No fracture or spondylolisthesis.   Electronically Signed   By: Lowella Grip III M.D.   On: 04/11/2015 13:50    Hospital Course: GUALBERTO WAHLEN is a 49 y.o. who was admitted to [facility]. They were brought to the operating room on 04/11/2015 and underwent Procedure(s): CENTRAL DECOMPRESSION,LUMBAR  HEMI-LAMINECTOMY MICRODISCECTOMY  L4 - L5 ON THE RIGHT, FORAMINOTOMY L4 AND L5 ROOT ON THE RIGHT.Marland Kitchen  Patient tolerated the procedure well and was later transferred to the recovery room and then to the orthopaedic floor for postoperative care.  They were given PO and IV analgesics for pain control following their surgery.  They were given 24 hours of postoperative antibiotics of  Anti-infectives    Start     Dose/Rate Route Frequency Ordered Stop   04/11/15 2200  ceFAZolin (ANCEF) IVPB 1 g/50 mL premix  Status:  Discontinued     1 g 100 mL/hr over 30 Minutes Intravenous 3 times per day 04/11/15 1748 04/12/15 1115   04/11/15 1403  polymyxin B 500,000 Units, bacitracin 50,000 Units in sodium chloride irrigation 0.9 % 500 mL irrigation  Status:  Discontinued       As needed 04/11/15 1403 04/11/15 1524   04/11/15 1104  ceFAZolin (ANCEF) IVPB 2 g/50 mL premix     2 g 100 mL/hr over 30 Minutes Intravenous On call to O.R. 04/11/15 1104 04/11/15 1310     and started on DVT prophylaxis in the form of Aspirin.   PT and OT were ordered.  Discharge planning consulted to help with postop disposition and equipment needs.  Patient had a good night on the evening of surgery.  They started to get up OOB with therapy on day one. Dressing was changed and the incision was clean and dry.  Patient was seen in rounds and was ready to go home.   Diet: Regular diet Activity:WBAT Follow-up:in 2 weeks Disposition - Home Discharged Condition: stable   Discharge Instructions    Call MD / Call 911    Complete by:  As directed   If you experience chest pain or shortness of breath, CALL 911 and be transported to the hospital emergency room.  If you develope a fever above 101 F, pus (white drainage) or increased drainage or redness at the  wound, or calf pain, call your surgeon's office.     Constipation Prevention    Complete by:  As directed   Drink plenty of fluids.  Prune juice may be helpful.  You may use a stool  softener, such as Colace (over the counter) 100 mg twice a day.  Use MiraLax (over the counter) for constipation as needed.     Diet general    Complete by:  As directed      Discharge instructions    Complete by:  As directed   For the first few days, remove your dressing, tape a piece of saran wrap over your incision. Take your shower, then remove the saran wrap and put a clean dressing on. After three days you can shower without the saran wrap.  Call Dr. Gladstone Lighter if any wound complications or temperature of 101 degrees F or over.  Call the office for an appointment to see Dr. Gladstone Lighter in two weeks: [phone] and ask for Dr. Charlestine Night nurse, Brunilda Payor.     Increase activity slowly as tolerated    Complete by:  As directed      Lifting restrictions    Complete by:  As directed   No lifting            Medication List    STOP taking these medications        ADVIL 200 MG tablet  Generic drug:  ibuprofen     HYDROcodone-acetaminophen 5-325 MG per tablet  Commonly known as:  NORCO/VICODIN      TAKE these medications        methocarbamol 500 MG tablet  Commonly known as:  ROBAXIN  Take 1 tablet (500 mg total) by mouth every 6 (six) hours as needed for muscle spasms.     oxyCODONE-acetaminophen 5-325 MG per tablet  Commonly known as:  PERCOCET/ROXICET  Take 1-2 tablets by mouth every 4 (four) hours as needed for moderate pain (moderate pain not relieved with hydrocodone).           Follow-up Information    Follow up with GIOFFRE,RONALD A, MD. Schedule an appointment as soon as possible for a visit in 2 weeks.   Specialty:  Orthopedic Surgery   Contact information:   [address] [place] [address] [phone]       Signed: Ardeen Jourdain, PA-C Orthopaedic Surgery 04/16/2015, 10:38 AM

## 2016-05-18 IMAGING — CR DG CHEST 2V
2 series · 2 of 2 positions shown · non-contrast
Comparison: None.

CLINICAL DATA: Preoperative clearance.  Preop back surgery

EXAM:
CHEST  2 VIEW

[w chest pa]
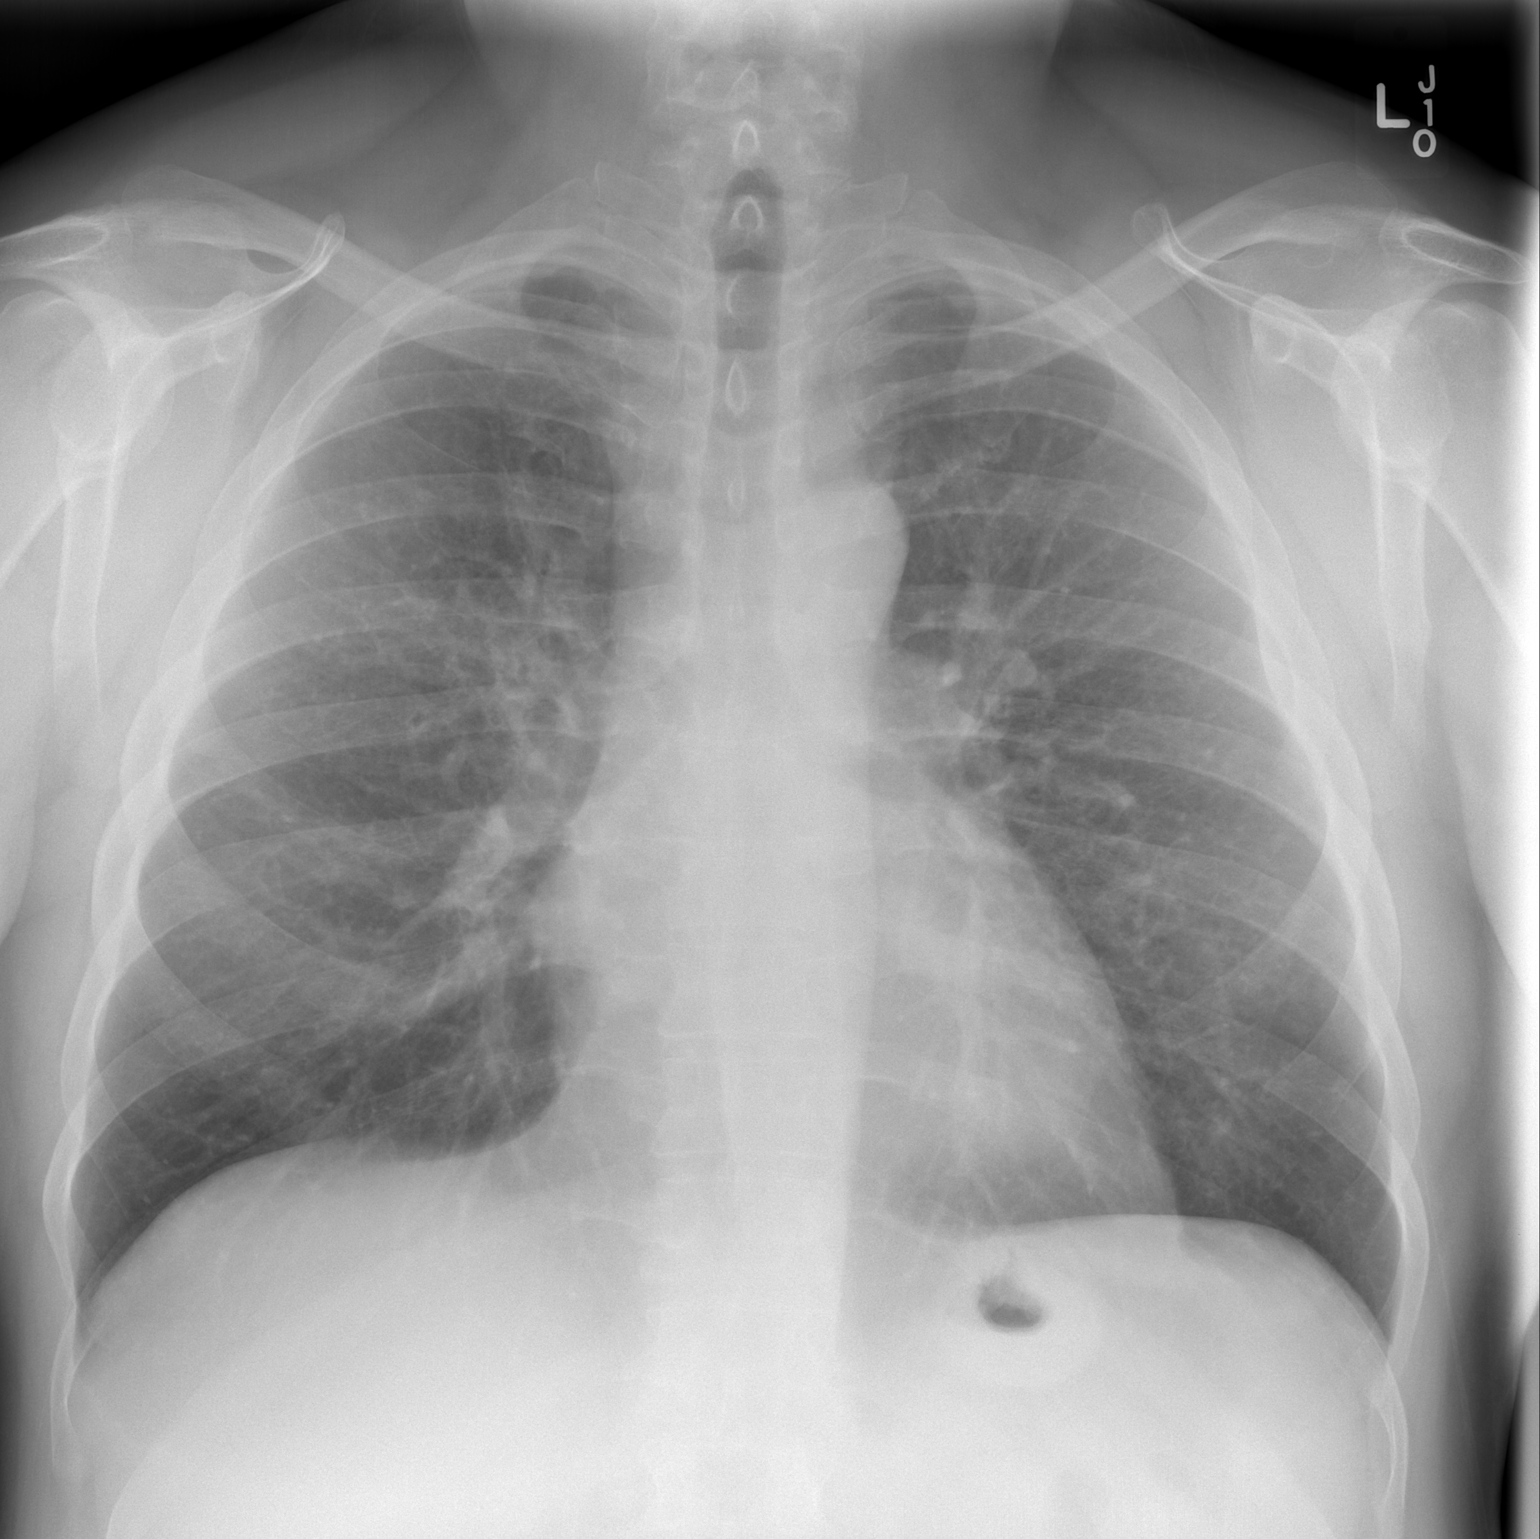

[w chest lat]
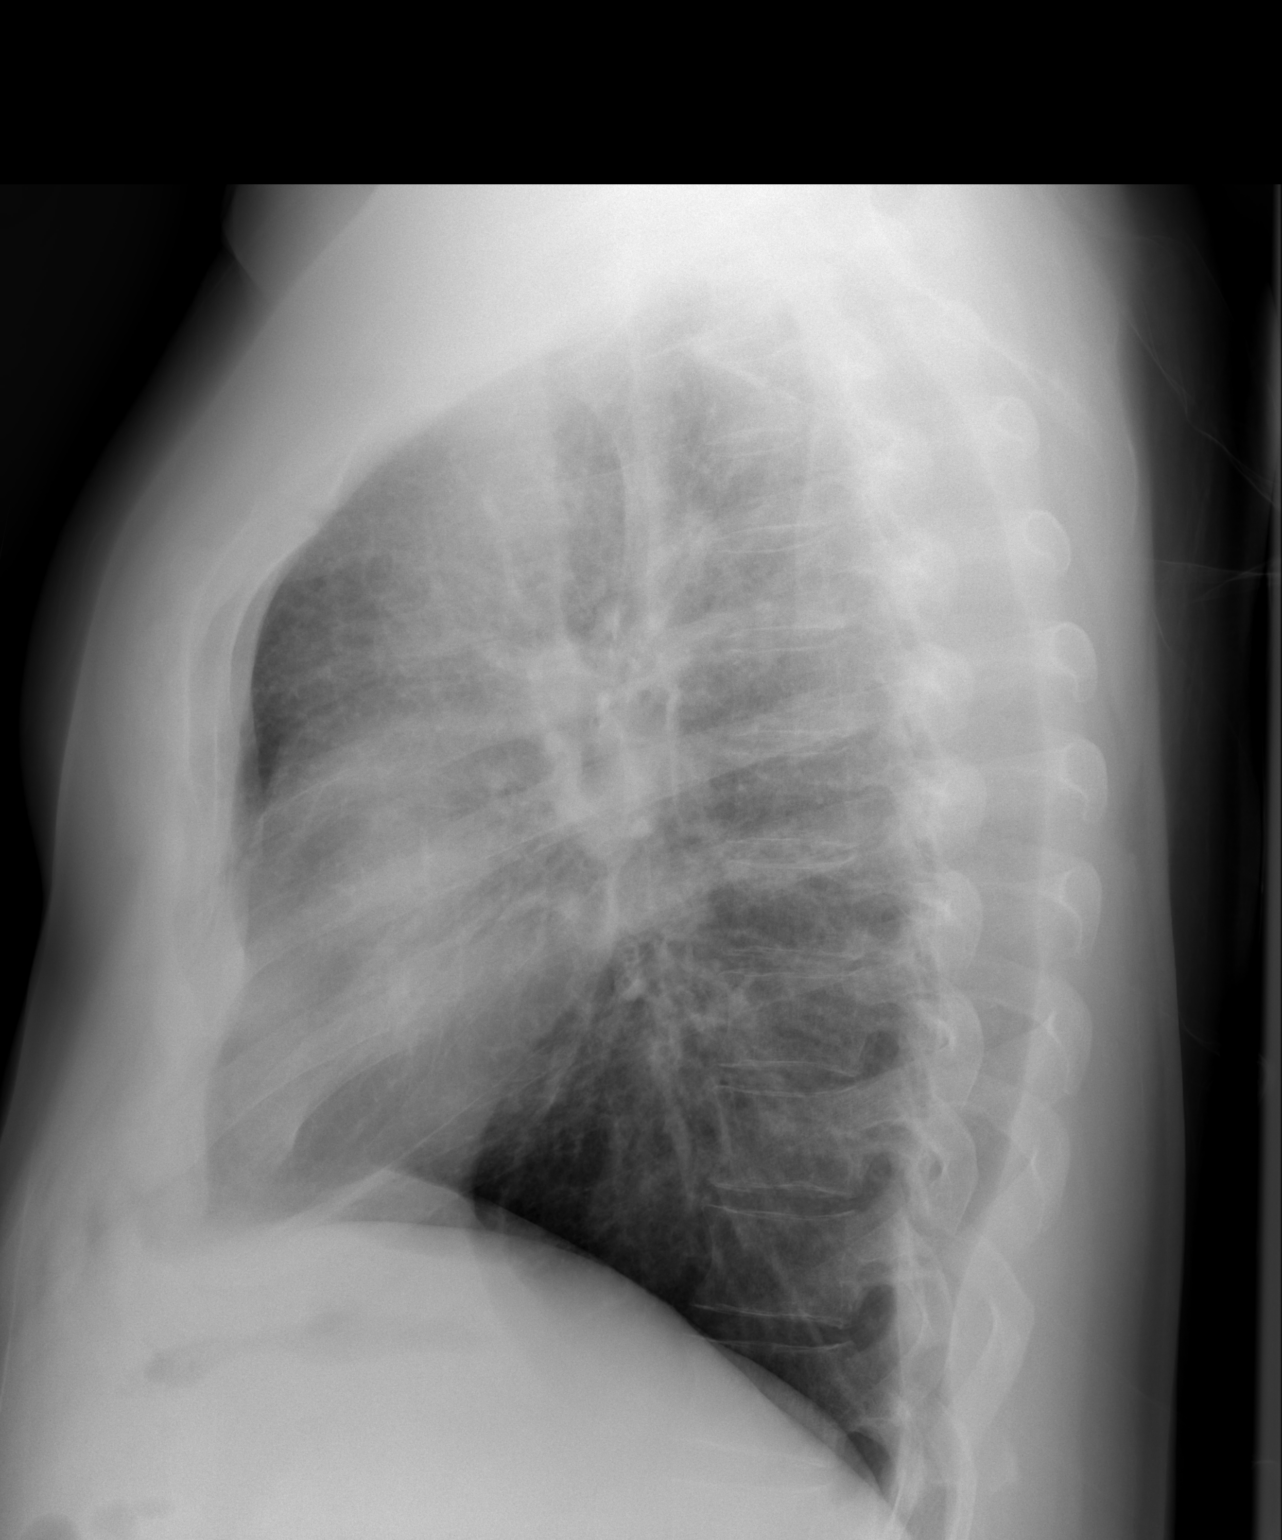

[2 of 2 positions shown; findings below may reference images not displayed]

FINDINGS: The heart size and mediastinal contours are within normal limits.
Both lungs are clear. The visualized skeletal structures are
unremarkable.
IMPRESSION: No active cardiopulmonary disease.

## 2016-05-24 IMAGING — DX DG SPINE 1V PORT
1 series · 1 of 1 positions shown · non-contrast
Comparison: None.

CLINICAL DATA: Lumbar laminectomy L4-5

EXAM:
PORTABLE SPINE - 1 VIEW

[l-spine x-table]
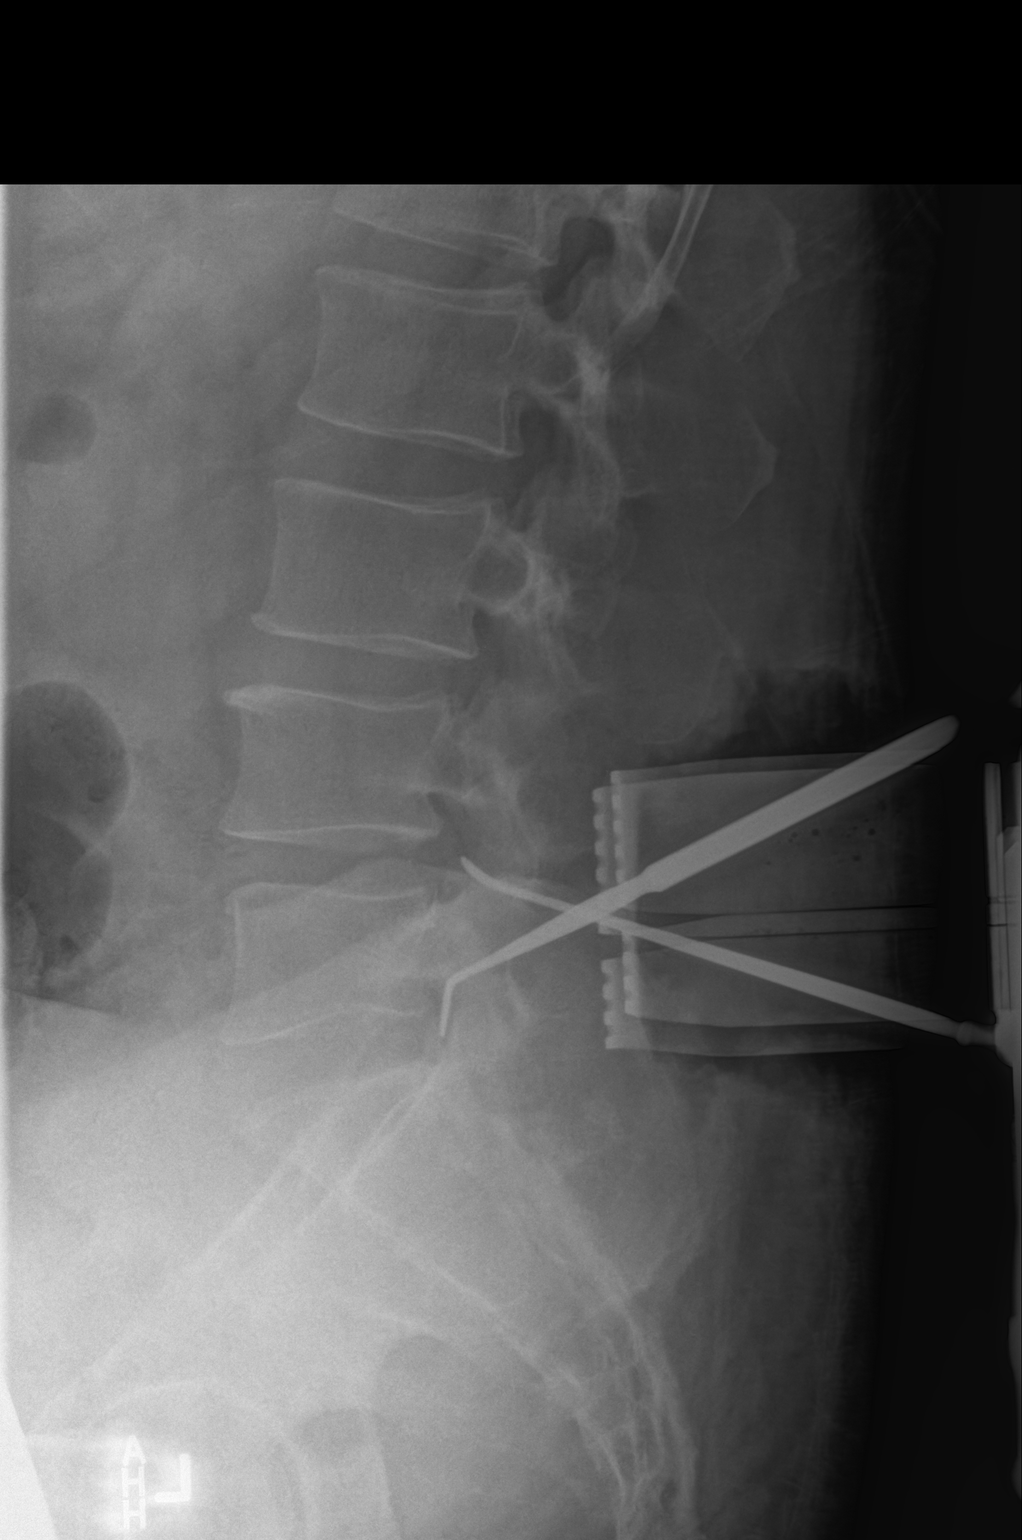

[1 of 1 positions shown; findings below may reference images not displayed]

FINDINGS: Surgical instrument directed at the L4-5 disc space. Instrument
overlies the spinal canal.

There is a second instrument directed at the L5-S1 disc space
overlying the spinal canal.
IMPRESSION: Surgical instrument localizes the L4-5 disc space. Second instrument
directed at the L5-S1 disc space.

## 2016-05-24 IMAGING — DX DG SPINE 1V PORT
1 series · 1 of 1 positions shown · non-contrast
Comparison: None.

CLINICAL DATA: L4-5 hemilaminectomy

EXAM:
PORTABLE SPINE - 1 VIEW

[l-spine x-table]
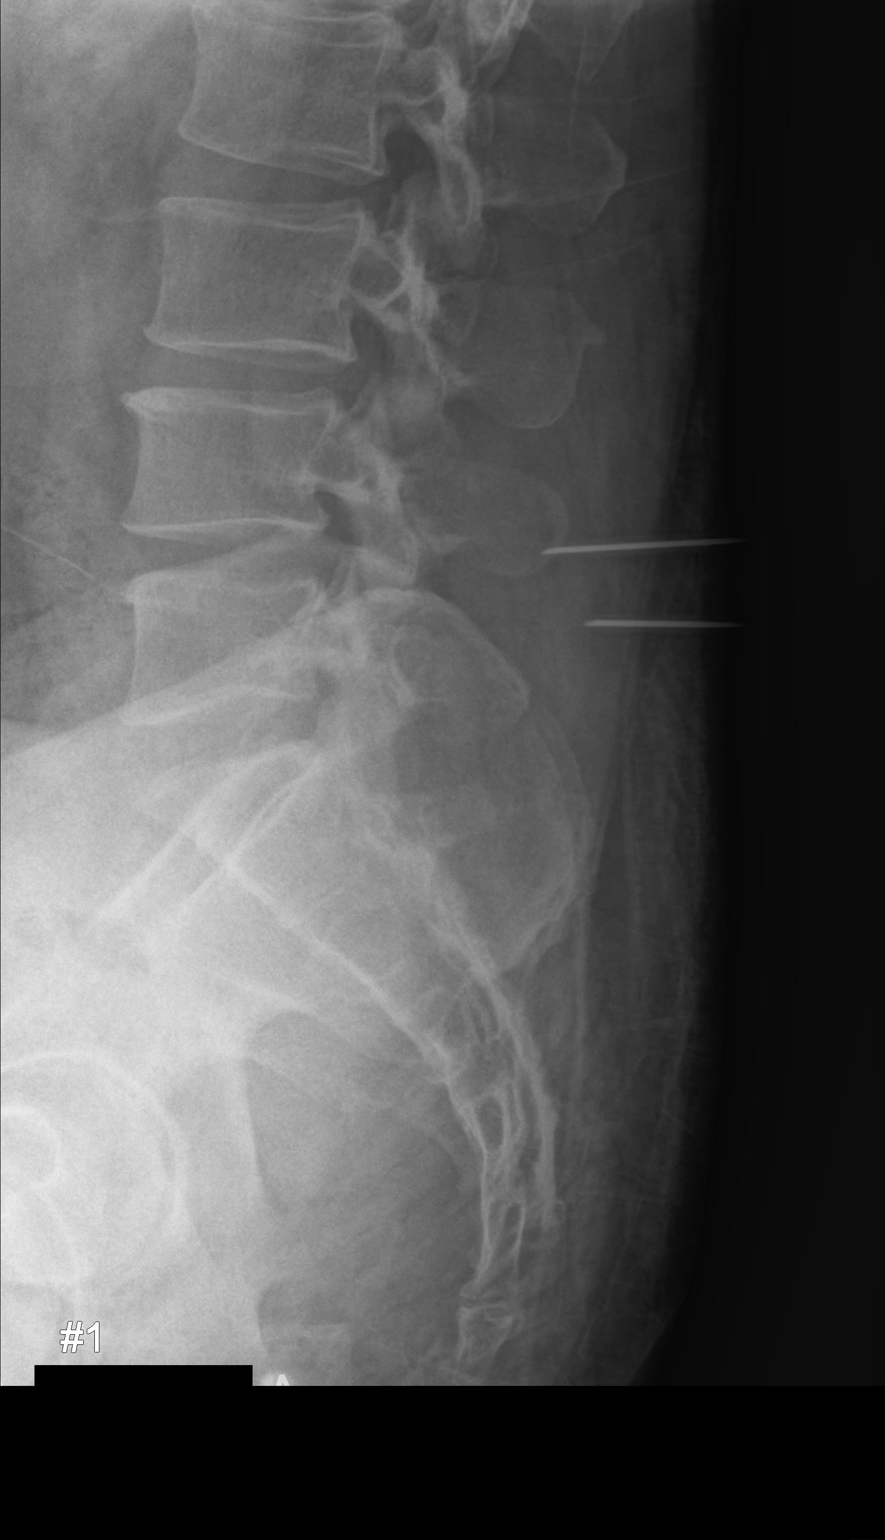

[1 of 1 positions shown; findings below may reference images not displayed]

FINDINGS: Cross-table lateral lumbar image obtained. The more superiorly place
metallic probe is posterior to the L4-5 interspace level. The more
inferiorly placed probe is posterior to the mid L5 vertebral body
level. No fracture or spondylolisthesis. Disc spaces appear intact.
IMPRESSION: Metallic probe tips are posterior to the L4-5 and mid L5 levels. No
fracture or spondylolisthesis.

## 2016-05-24 IMAGING — DX DG SPINE 1V PORT
1 series · 1 of 1 positions shown · non-contrast
Comparison: Lateral lumbar spine from earlier today

CLINICAL DATA: Lumbar laminectomy at L4-5

EXAM:
PORTABLE SPINE - 1 VIEW

[l-spine x-table]
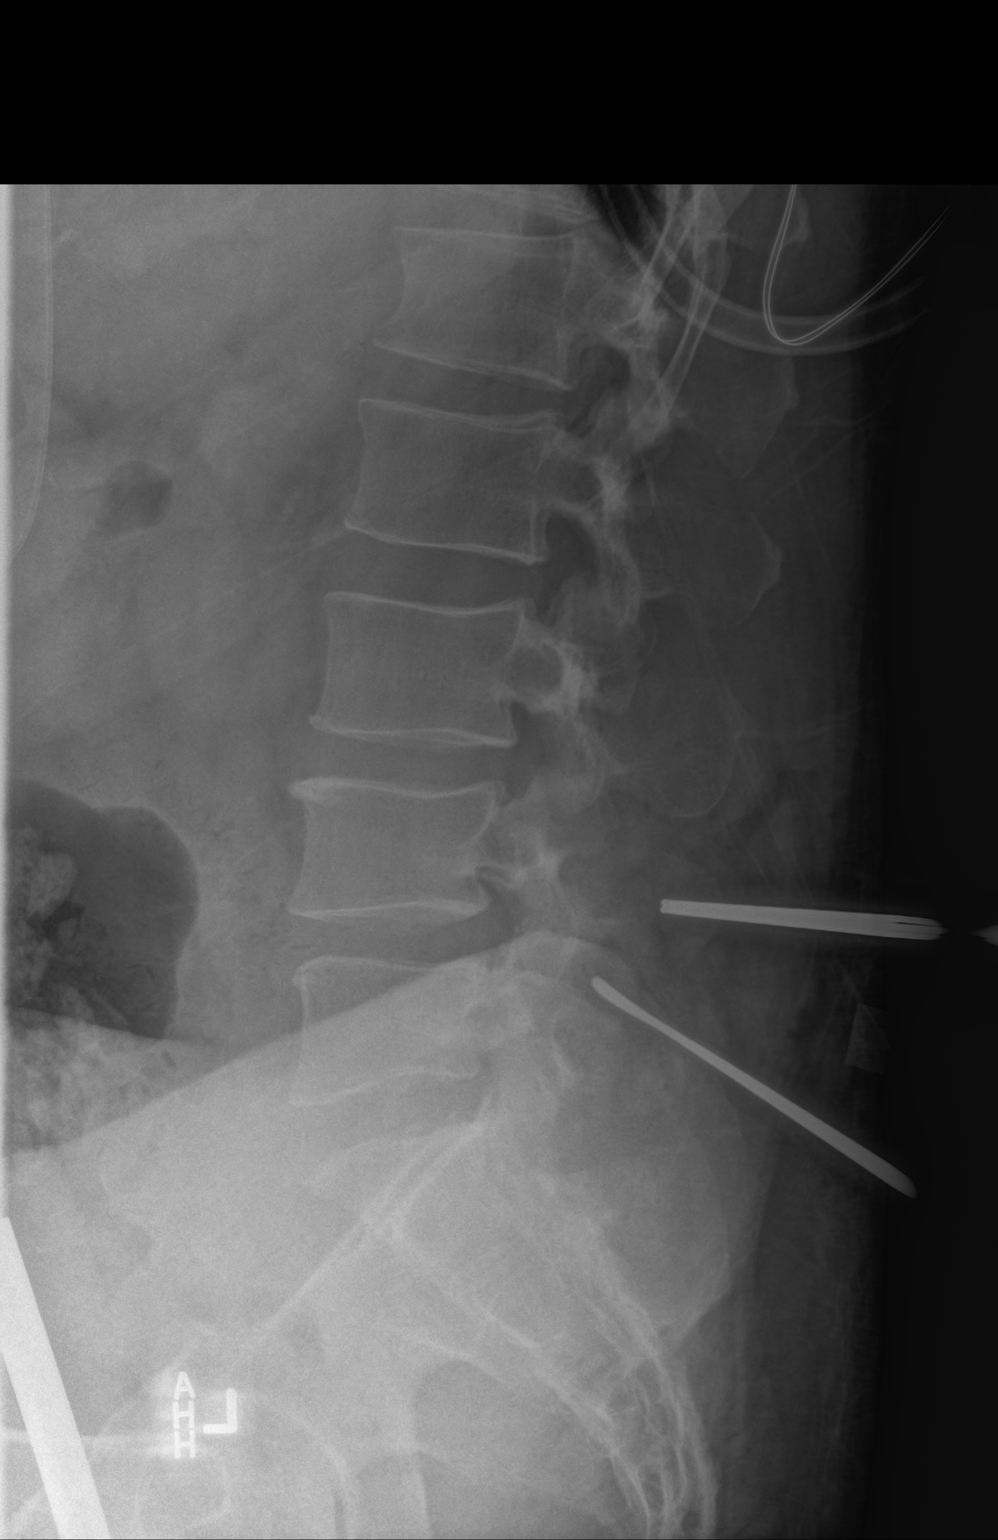

[1 of 1 positions shown; findings below may reference images not displayed]

FINDINGS: Cross-table lateral lumbar spine view labeled 2 for localization
shows a clamp on the spinous process of L4 and an instrument
posteriorly directed toward the L4-5 interspace for localization.
IMPRESSION: Localization of L4-5.
